# Patient Record
Sex: Female | Born: 1985 | Race: Black or African American | Hispanic: No | Smoking: Current every day smoker
Health system: Southern US, Community
[De-identification: ages and names within clinical notes are randomized; demographics above are authoritative.]

---

## 2017-08-27 ENCOUNTER — Encounter (HOSPITAL_COMMUNITY): Payer: Self-pay | Admitting: Emergency Medicine

## 2017-08-27 ENCOUNTER — Emergency Department (HOSPITAL_COMMUNITY)
Admission: EM | Admit: 2017-08-27 | Discharge: 2017-08-27 | Disposition: A | Discharge status: Home / Self Care | Payer: Medicaid Other | Attending: Emergency Medicine | Admitting: Emergency Medicine

## 2017-08-27 DIAGNOSIS — F17200 Nicotine dependence, unspecified, uncomplicated: Secondary | ICD-10-CM | POA: Insufficient documentation

## 2017-08-27 DIAGNOSIS — B9689 Other specified bacterial agents as the cause of diseases classified elsewhere: Secondary | ICD-10-CM

## 2017-08-27 DIAGNOSIS — A5901 Trichomonal vulvovaginitis: Secondary | ICD-10-CM | POA: Diagnosis not present

## 2017-08-27 DIAGNOSIS — N898 Other specified noninflammatory disorders of vagina: Secondary | ICD-10-CM | POA: Diagnosis present

## 2017-08-27 DIAGNOSIS — N760 Acute vaginitis: Secondary | ICD-10-CM | POA: Diagnosis not present

## 2017-08-27 LAB — URINALYSIS, ROUTINE W REFLEX MICROSCOPIC
Bilirubin Urine: NEGATIVE
Glucose, UA: NEGATIVE mg/dL
Hgb urine dipstick: NEGATIVE
Ketones, ur: NEGATIVE mg/dL
Leukocytes, UA: NEGATIVE
Nitrite: NEGATIVE
Protein, ur: NEGATIVE mg/dL
Specific Gravity, Urine: 1.008 (ref 1.005–1.030)
pH: 6 (ref 5.0–8.0)

## 2017-08-27 LAB — WET PREP, GENITAL
Sperm: NONE SEEN
YEAST WET PREP: NONE SEEN

## 2017-08-27 LAB — POC URINE PREG, ED: Preg Test, Ur: NEGATIVE

## 2017-08-27 MED ORDER — METRONIDAZOLE 500 MG PO TABS
2000.0000 mg | ORAL_TABLET | Freq: Once | ORAL | Status: AC
  Administered 2017-08-27: 2000 mg via ORAL
  Filled 2017-08-27: qty 4

## 2017-08-27 MED ORDER — CEFTRIAXONE SODIUM 250 MG IJ SOLR
250.0000 mg | Freq: Once | INTRAMUSCULAR | Status: AC
  Administered 2017-08-27: 250 mg via INTRAMUSCULAR
  Filled 2017-08-27: qty 250

## 2017-08-27 MED ORDER — METRONIDAZOLE 500 MG PO TABS: 500.0000 mg | ORAL_TABLET | Freq: Two times a day (BID) | ORAL | 0 refills | Status: DC

## 2017-08-27 MED ORDER — AZITHROMYCIN 250 MG PO TABS
1000.0000 mg | ORAL_TABLET | Freq: Once | ORAL | Status: AC
  Administered 2017-08-27: 1000 mg via ORAL
  Filled 2017-08-27: qty 4

## 2017-08-27 NOTE — ED Triage Notes (Signed)
Pt presents to ED for assessment of vaginal itching, discharge x 2 days.  States she "may have been exposed to a bad soap".

## 2017-08-27 NOTE — Discharge Instructions (Addendum)
Take Flagyl as prescribed until all gone.  No intercourse for a week.  Please make sure that your partners treated as well.  Your gonorrhea and Chlamydia cultures are still pending and if come back abnormal we will contact you.  Return as needed.

## 2017-08-27 NOTE — ED Notes (Signed)
EMT called for patient with no response x 3

## 2017-08-27 NOTE — ED Provider Notes (Signed)
[facility] Note   CSN: [id number] Arrival date & time: 08/27/17  1610     History   Chief Complaint Chief Complaint  Patient presents with  . Vaginal Discharge    HPI Stefanie Murray is a 32 y.o. female.  =    Stefanie Murray is a 32 y.o. female presents emergency department complaining of vaginal discharge.  She states vaginal discharge for several days.  She states she always uses protection, condoms, but states once the condom came off.  She also admits to itching.  Denies any pain.  Denies any vaginal bleeding.  States discharge is white.  No recent antibiotics.  She states she recently switched to a new soap and maybe this was causing her symptoms.  Nothing making her symptoms better or worse.  No urinary symptoms.  History reviewed. No pertinent past medical history.  There are no active problems to display for this patient.   History reviewed. No pertinent surgical history.   OB History   None      Home Medications    Prior to Admission medications   Not on File    Family History History reviewed. No pertinent family history.  Social History Social History   Tobacco Use  . Smoking status: Current Every Day Smoker    Packs/day: 0.25  . Smokeless tobacco: Never Used  Substance Use Topics  . Alcohol use: Not Currently  . Drug use: Never     Allergies   Patient has no allergy information on record.   Review of Systems Review of Systems  Constitutional: Negative for chills and fever.  Respiratory: Negative for cough, chest tightness and shortness of breath.   Cardiovascular: Negative for chest pain, palpitations and leg swelling.  Gastrointestinal: Negative for abdominal pain, diarrhea, nausea and vomiting.  Genitourinary: Positive for vaginal discharge. Negative for dysuria, flank pain, pelvic pain, vaginal bleeding and vaginal pain.  Musculoskeletal: Negative for arthralgias, myalgias, neck pain  and neck stiffness.  Skin: Negative for rash.  Neurological: Negative for dizziness, weakness and headaches.  All other systems reviewed and are negative.    Physical Exam Updated Vital Signs BP (!) 141/90 (BP Location: Right Arm)   Pulse 61   Temp 98.1 F (36.7 C) (Oral)   Resp 16   Ht 5\' 8"  (1.727 m)   Wt 81.6 kg   LMP 08/24/2017   SpO2 100%   BMI 27.37 kg/m   Physical Exam  Constitutional: She appears well-developed and well-nourished. No distress.  HENT:  Head: Normocephalic.  Eyes: Conjunctivae are normal.  Neck: Neck supple.  Cardiovascular: Normal rate, regular rhythm and normal heart sounds.  Pulmonary/Chest: Effort normal and breath sounds normal. No respiratory distress. She has no wheezes. She has no rales.  Abdominal: Soft. Bowel sounds are normal. She exhibits no distension. There is no tenderness. There is no rebound.  Genitourinary:  Genitourinary Comments: Normal external genitalia. Normal vaginal canal. Small thin white discharge. Cervix is normal, closed. No CMT. No uterine or adnexal tenderness. No masses palpated.    Musculoskeletal: She exhibits no edema.  Neurological: She is alert.  Skin: Skin is warm and dry.  Psychiatric: She has a normal mood and affect. Her behavior is normal.  Nursing note and vitals reviewed.    ED Treatments / Results  Labs (all labs ordered are listed, but only abnormal results are displayed) Labs Reviewed  URINALYSIS, ROUTINE W REFLEX MICROSCOPIC - Abnormal; Notable for the following components:  Result Value   Color, Urine STRAW (*)    All other components within normal limits  WET PREP, GENITAL  POC URINE PREG, ED  GC/CHLAMYDIA PROBE AMP ([place]) NOT AT [facility]    EKG None  Radiology No results found.  Procedures Procedures (including critical care time)  Medications Ordered in ED Medications - No data to display   Initial Impression / Assessment and Plan / ED Course  I have reviewed the  triage vital signs and the nursing notes.  Pertinent labs & imaging results that were available during my care of the patient were reviewed by me and considered in my medical decision making (see chart for details).     Patient with vaginal discharge onset several days ago, no abdominal pain, no fever chills, urinary symptoms.  Pelvic exam is really unremarkable, however wet prep shows trichomonas, clue cells, few WBCs.  Will cover with Rocephin and Zithromax in emergency department.  2 g of Flagyl given as well.  We will send her home with flagyl and close outpatient follow up as needed. Return precautions discussed.   Vitals:   08/27/17 1631 08/27/17 1749  BP: (!) 141/90 120/83  Pulse: 61 60  Resp: 16   Temp: 98.1 F (36.7 C)   SpO2: 100% 100%     Final Clinical Impressions(s) / ED Diagnoses   Final diagnoses:  Trichomonas vaginitis  BV (bacterial vaginosis)    ED Discharge Orders         Ordered    metroNIDAZOLE (FLAGYL) 500 MG tablet  2 times daily     08/27/17 1928           Jaynie Crumble, PA-C 08/28/17 0034    Raeford Razor, MD 09/01/17 1441

## 2017-08-29 LAB — GC/CHLAMYDIA PROBE AMP (~~LOC~~) NOT AT ARMC
Chlamydia: NEGATIVE
NEISSERIA GONORRHEA: NEGATIVE

## 2018-07-04 ENCOUNTER — Encounter (HOSPITAL_COMMUNITY): Payer: Self-pay

## 2018-07-04 ENCOUNTER — Other Ambulatory Visit: Payer: Self-pay

## 2018-07-04 ENCOUNTER — Ambulatory Visit (HOSPITAL_COMMUNITY)
Admission: EM | Admit: 2018-07-04 | Discharge: 2018-07-04 | Disposition: A | Discharge status: Home / Self Care | Payer: Medicaid Other | Attending: Family Medicine | Admitting: Family Medicine

## 2018-07-04 DIAGNOSIS — R112 Nausea with vomiting, unspecified: Secondary | ICD-10-CM

## 2018-07-04 DIAGNOSIS — Z3202 Encounter for pregnancy test, result negative: Secondary | ICD-10-CM

## 2018-07-04 LAB — POCT URINALYSIS DIP (DEVICE)
Glucose, UA: NEGATIVE mg/dL
Ketones, ur: 15 mg/dL — AB
Leukocytes,Ua: NEGATIVE
Nitrite: NEGATIVE
Protein, ur: 30 mg/dL — AB
Specific Gravity, Urine: 1.025 (ref 1.005–1.030)
Urobilinogen, UA: 0.2 mg/dL (ref 0.0–1.0)
pH: 7 (ref 5.0–8.0)

## 2018-07-04 LAB — POCT PREGNANCY, URINE: Preg Test, Ur: NEGATIVE

## 2018-07-04 MED ORDER — ONDANSETRON 4 MG PO TBDP
ORAL_TABLET | ORAL | Status: AC
  Filled 2018-07-04: qty 1

## 2018-07-04 MED ORDER — ONDANSETRON HCL 4 MG PO TABS: 4.0000 mg | ORAL_TABLET | Freq: Four times a day (QID) | ORAL | 0 refills | Status: AC

## 2018-07-04 MED ORDER — ONDANSETRON 4 MG PO TBDP: 4.0000 mg | ORAL_TABLET | Freq: Once | ORAL | Status: DC

## 2018-07-04 MED ORDER — ONDANSETRON 4 MG PO TBDP
4.0000 mg | ORAL_TABLET | Freq: Once | ORAL | Status: AC
  Administered 2018-07-04: 4 mg via ORAL

## 2018-07-04 MED ORDER — KETOROLAC TROMETHAMINE 30 MG/ML IJ SOLN
30.0000 mg | Freq: Once | INTRAMUSCULAR | Status: AC
  Administered 2018-07-04: 30 mg via INTRAMUSCULAR

## 2018-07-04 MED ORDER — KETOROLAC TROMETHAMINE 30 MG/ML IJ SOLN
INTRAMUSCULAR | Status: AC
  Filled 2018-07-04: qty 1

## 2018-07-04 NOTE — ED Triage Notes (Signed)
Pt states she has back pain, nausea and vomiting . This started today early this morning.

## 2018-07-04 NOTE — ED Provider Notes (Signed)
[facility]    CSN: [id number] Arrival date & time: 07/04/18  1643     History   Chief Complaint Chief Complaint  Patient presents with  . Back Pain  . Emesis    HPI Stefanie Murray is a 33 y.o. female presenting for acute concern of nausea and vomiting.  Patient states that she developed nausea and vomiting early this morning.  Has not been able to keep down any fluid, food.  States that her emesis is non-biliary, without blood.  Patient is endorsing generalized abdominal pain as well as some back pain.  Patient states "it just hurts all over ".  Denies urinary symptoms, pelvic or vaginal pain or discharge.  Patient continuously requests medication for her stomach pain.  States that she feels nauseous.  Overall, history is limited second to patient's cooperation and lack of engagement with examiner.   History reviewed. No pertinent past medical history.  There are no active problems to display for this patient.   History reviewed. No pertinent surgical history.  OB History   No obstetric history on file.      Home Medications    Prior to Admission medications   Medication Sig Start Date End Date Taking? Authorizing Provider  metroNIDAZOLE (FLAGYL) 500 MG tablet Take 1 tablet (500 mg total) by mouth 2 (two) times daily. 08/27/17   Kirichenko, Tatyana, PA-C  ondansetron (ZOFRAN) 4 MG tablet Take 1 tablet (4 mg total) by mouth every 6 (six) hours for 5 days. 07/04/18 07/09/18  Hall-Potvin, [place], PA-C    Family History History reviewed. No pertinent family history.  Social History Social History   Tobacco Use  . Smoking status: Current Every Day Smoker    Packs/day: 0.25  . Smokeless tobacco: Never Used  Substance Use Topics  . Alcohol use: Not Currently  . Drug use: Never     Allergies   Patient has no known allergies.   Review of Systems As per HPI   Physical Exam Triage Vital Signs ED Triage Vitals  Enc Vitals Group     BP      Pulse       Resp      Temp      Temp src      SpO2      Weight      Height      Head Circumference      Peak Flow      Pain Score      Pain Loc      Pain Edu?      Excl. in [place]?    No data found.  Updated Vital Signs BP (!) 118/58   Pulse (!) 115   Temp 99.8 F (37.7 C) (Oral)   Resp 16   Wt 180 lb (81.6 kg)   LMP 06/27/2018   SpO2 97%   BMI 27.37 kg/m   Visual Acuity Right Eye Distance:   Left Eye Distance:   Bilateral Distance:    Right Eye Near:   Left Eye Near:    Bilateral Near:     Physical Exam Constitutional:      General: She is not in acute distress. HENT:     Head: Normocephalic and atraumatic.  Eyes:     General: No scleral icterus.    Pupils: Pupils are equal, round, and reactive to light.  Cardiovascular:     Rate and Rhythm: Normal rate.  Pulmonary:     Effort: Pulmonary effort is normal.  Abdominal:     General: Abdomen is flat. Bowel sounds are normal. There is no distension.     Palpations: Abdomen is soft.     Tenderness: There is abdominal tenderness. There is no guarding or rebound.     Comments: Patient has generalized tenderness to palpation without rebound, guarding.  Patient refuses to sit up, unable to assess CVA tenderness.  Skin:    Coloration: Skin is not jaundiced or pale.  Neurological:     Mental Status: She is alert and oriented to person, place, and time.      UC Treatments / Results  Labs (all labs ordered are listed, but only abnormal results are displayed) Labs Reviewed  POCT URINALYSIS DIP (DEVICE) - Abnormal; Notable for the following components:      Result Value   Bilirubin Urine SMALL (*)    Ketones, ur 15 (*)    Hgb urine dipstick LARGE (*)    Protein, ur 30 (*)    All other components within normal limits  POC URINE PREG, ED  POCT PREGNANCY, URINE    EKG   Radiology No results found.  Procedures Procedures (including critical care time)  Medications Ordered in UC Medications  ondansetron  (ZOFRAN-ODT) disintegrating tablet 4 mg (4 mg Oral Given 07/04/18 1811)  ketorolac (TORADOL) 30 MG/ML injection 30 mg (30 mg Intramuscular Given 07/04/18 1909)  ondansetron (ZOFRAN-ODT) 4 MG disintegrating tablet (has no administration in time range)  ketorolac (TORADOL) 30 MG/ML injection (has no administration in time range)    Initial Impression / Assessment and Plan / UC Course  I have reviewed the triage vital signs and the nursing notes.  Pertinent labs & imaging results that were available during my care of the patient were reviewed by me and considered in my medical decision making (see chart for details).     33 year old female with 1 day history of nausea and vomiting.  Patient reporting poor p.o. intake, and is tachycardic with elevated temperature in office - patient elected to try oral rehydration with Zofran, declined IV fluids with zofran.  Patient was given sublingual dissolvable Zofran which she tolerated well.  States that this improved her nausea and was able to sip on some water prior to discharge.  Pregnancy test negative, patient given Toradol for pain, which she also tolerated well.  POCT urinalysis significant for small bilirubin, 15 ketones, large hemoglobin, 30 protein.  Negative for nitrates or leukocytes, did not send culture.  Discussed utility of COVID testing given sudden onset and severity of symptoms in setting of global pandemic, patient seems to be agreeable to this: Test ordered.  Instructed patient to stay at home, take Zofran sublingually and focus on slow, oral rehydration with water and Gatorade.  Patient to follow-up tomorrow in urgent care for reevaluation/possible IV fluids, or go to ER overnight if symptoms worsen.  Patient will need to isolate until results of COVID test.  Patient is agreeable to current treatment plan, electing to go home for oral rehydration.  She also verbalized understanding of return precautions. Final Clinical Impressions(s) / UC  Diagnoses   Final diagnoses:  Nausea and vomiting, intractability of vomiting not specified, unspecified vomiting type     Discharge Instructions     May use Zofran as needed for nausea. It is very important to go home, sip on fluids throughout the evening.  If you are not feeling better tomorrow please return to our office for further evaluation and treatment. If you feel significantly worse in  the interim, please go to the ER for further evaluation.    ED Prescriptions    Medication Sig Dispense Auth. Provider   ondansetron (ZOFRAN) 4 MG tablet Take 1 tablet (4 mg total) by mouth every 6 (six) hours for 5 days. 20 tablet Hall-Potvin, [place], PA-C     Controlled Substance Prescriptions [place] Controlled Substance Registry consulted? Not Applicable   Shea EvansHall-Potvin, Brittany, [place] JerseyPA-C 07/04/18 2134

## 2018-07-04 NOTE — Discharge Instructions (Signed)
May use Zofran as needed for nausea. It is very important to go home, sip on fluids throughout the evening.  If you are not feeling better tomorrow please return to our office for further evaluation and treatment. If you feel significantly worse in the interim, please go to the ER for further evaluation.

## 2018-07-05 ENCOUNTER — Telehealth: Payer: Self-pay | Admitting: *Deleted

## 2018-07-05 NOTE — Telephone Encounter (Signed)
-----   Message from [place], [place] sent at 07/04/2018  9:31 PM EDT ----- Regarding: covid testing needed Patient with sudden onset myalgias, elevated temperature, nausea, vomiting, abdominal pain.

## 2018-07-05 NOTE — Telephone Encounter (Signed)
Have attempted to contact patient @ [phone] and number is not working.

## 2018-07-07 NOTE — Telephone Encounter (Signed)
Attempted to call pt. At [phone]; rec'd automated message stating the number is "not reachable."   Unable to find any alternate contact numbers for the patient.

## 2018-07-13 ENCOUNTER — Ambulatory Visit (HOSPITAL_COMMUNITY)
Admission: EM | Admit: 2018-07-13 | Discharge: 2018-07-13 | Discharge status: Against Medical Advice | Payer: Medicaid Other

## 2018-07-13 NOTE — ED Notes (Signed)
Called x3 in the lobby, no answer.

## 2019-06-24 ENCOUNTER — Emergency Department (HOSPITAL_COMMUNITY): Payer: Medicaid Other

## 2019-06-24 ENCOUNTER — Other Ambulatory Visit: Payer: Self-pay

## 2019-06-24 ENCOUNTER — Emergency Department (HOSPITAL_COMMUNITY)
Admission: EM | Admit: 2019-06-24 | Discharge: 2019-06-24 | Disposition: A | Discharge status: Home / Self Care | Payer: Medicaid Other | Attending: Emergency Medicine | Admitting: Emergency Medicine

## 2019-06-24 ENCOUNTER — Encounter (HOSPITAL_COMMUNITY): Payer: Self-pay

## 2019-06-24 DIAGNOSIS — Y92009 Unspecified place in unspecified non-institutional (private) residence as the place of occurrence of the external cause: Secondary | ICD-10-CM | POA: Diagnosis not present

## 2019-06-24 DIAGNOSIS — R519 Headache, unspecified: Secondary | ICD-10-CM | POA: Insufficient documentation

## 2019-06-24 DIAGNOSIS — M79605 Pain in left leg: Secondary | ICD-10-CM

## 2019-06-24 DIAGNOSIS — R109 Unspecified abdominal pain: Secondary | ICD-10-CM

## 2019-06-24 DIAGNOSIS — S40022A Contusion of left upper arm, initial encounter: Secondary | ICD-10-CM | POA: Diagnosis not present

## 2019-06-24 DIAGNOSIS — Y999 Unspecified external cause status: Secondary | ICD-10-CM | POA: Diagnosis not present

## 2019-06-24 DIAGNOSIS — S301XXA Contusion of abdominal wall, initial encounter: Secondary | ICD-10-CM | POA: Insufficient documentation

## 2019-06-24 DIAGNOSIS — F17200 Nicotine dependence, unspecified, uncomplicated: Secondary | ICD-10-CM | POA: Insufficient documentation

## 2019-06-24 DIAGNOSIS — S02642A Fracture of ramus of left mandible, initial encounter for closed fracture: Secondary | ICD-10-CM

## 2019-06-24 DIAGNOSIS — S4992XA Unspecified injury of left shoulder and upper arm, initial encounter: Secondary | ICD-10-CM | POA: Diagnosis present

## 2019-06-24 DIAGNOSIS — M79602 Pain in left arm: Secondary | ICD-10-CM

## 2019-06-24 DIAGNOSIS — S7012XA Contusion of left thigh, initial encounter: Secondary | ICD-10-CM | POA: Diagnosis not present

## 2019-06-24 DIAGNOSIS — M7062 Trochanteric bursitis, left hip: Secondary | ICD-10-CM | POA: Insufficient documentation

## 2019-06-24 DIAGNOSIS — Y9389 Activity, other specified: Secondary | ICD-10-CM | POA: Insufficient documentation

## 2019-06-24 LAB — COMPREHENSIVE METABOLIC PANEL
ALT: 31 U/L (ref 0–44)
AST: 34 U/L (ref 15–41)
Albumin: 4.2 g/dL (ref 3.5–5.0)
Alkaline Phosphatase: 40 U/L (ref 38–126)
Anion gap: 12 (ref 5–15)
BUN: 8 mg/dL (ref 6–20)
CO2: 24 mmol/L (ref 22–32)
Calcium: 9 mg/dL (ref 8.9–10.3)
Chloride: 105 mmol/L (ref 98–111)
Creatinine, Ser: 0.72 mg/dL (ref 0.44–1.00)
GFR calc Af Amer: >60 mL/min (ref >60)
GFR calc non Af Amer: >60 mL/min (ref >60)
Glucose, Bld: 85 mg/dL (ref 70–99)
Potassium: 3.5 mmol/L (ref 3.5–5.1)
Sodium: 141 mmol/L (ref 135–145)
Total Bilirubin: 0.6 mg/dL (ref 0.3–1.2)
Total Protein: 7.5 g/dL (ref 6.5–8.1)

## 2019-06-24 LAB — CBC
HCT: 38.6 % (ref 36.0–46.0)
Hemoglobin: 12.2 g/dL (ref 12.0–15.0)
MCH: 26.5 pg (ref 26.0–34.0)
MCHC: 31.6 g/dL (ref 30.0–36.0)
MCV: 83.7 fL (ref 80.0–100.0)
Platelets: 207 10*3/uL (ref 150–400)
RBC: 4.61 MIL/uL (ref 3.87–5.11)
RDW: 14 % (ref 11.5–15.5)
WBC: 12.3 10*3/uL — ABNORMAL HIGH (ref 4.0–10.5)
nRBC: 0 % (ref 0.0–0.2)

## 2019-06-24 LAB — I-STAT BETA HCG BLOOD, ED (MC, WL, AP ONLY): I-stat hCG, quantitative: <5 m[IU]/mL (ref <5)

## 2019-06-24 MED ORDER — METHOCARBAMOL 500 MG PO TABS
500.0000 mg | ORAL_TABLET | Freq: Two times a day (BID) | ORAL | 0 refills | Status: DC | PRN
Start: 2019-06-24 — End: 2019-10-21

## 2019-06-24 MED ORDER — IOHEXOL 300 MG/ML  SOLN
100.0000 mL | Freq: Once | INTRAMUSCULAR | Status: AC | PRN
  Administered 2019-06-24: 100 mL via INTRAVENOUS

## 2019-06-24 MED ORDER — MORPHINE SULFATE (PF) 4 MG/ML IV SOLN
4.0000 mg | Freq: Once | INTRAVENOUS | Status: AC
  Administered 2019-06-24: 4 mg via INTRAVENOUS
  Filled 2019-06-24: qty 1

## 2019-06-24 MED ORDER — AMOXICILLIN 500 MG PO CAPS
500.0000 mg | ORAL_CAPSULE | Freq: Two times a day (BID) | ORAL | 0 refills | Status: AC
Start: 2019-06-24 — End: 2019-07-04

## 2019-06-24 MED ORDER — HYDROCODONE-ACETAMINOPHEN 5-325 MG PO TABS: 1.0000 | ORAL_TABLET | Freq: Four times a day (QID) | ORAL | 0 refills | Status: AC | PRN

## 2019-06-24 MED ORDER — SODIUM CHLORIDE (PF) 0.9 % IJ SOLN
INTRAMUSCULAR | Status: AC
  Filled 2019-06-24: qty 50
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
## 2019-06-24 NOTE — ED Notes (Signed)
ASO applied to the right ankle.

## 2019-10-20 ENCOUNTER — Encounter (HOSPITAL_COMMUNITY): Payer: Self-pay | Admitting: Emergency Medicine

## 2019-10-20 ENCOUNTER — Other Ambulatory Visit: Payer: Self-pay

## 2019-10-20 ENCOUNTER — Emergency Department (HOSPITAL_COMMUNITY)
Admission: EM | Admit: 2019-10-20 | Discharge: 2019-10-21 | Disposition: A | Discharge status: Home / Self Care | Payer: Medicaid Other | Attending: Emergency Medicine | Admitting: Emergency Medicine

## 2019-10-20 DIAGNOSIS — M62838 Other muscle spasm: Secondary | ICD-10-CM | POA: Insufficient documentation

## 2019-10-20 DIAGNOSIS — F17210 Nicotine dependence, cigarettes, uncomplicated: Secondary | ICD-10-CM | POA: Diagnosis not present

## 2019-10-20 DIAGNOSIS — M549 Dorsalgia, unspecified: Secondary | ICD-10-CM | POA: Diagnosis present

## 2019-10-20 MED ORDER — OXYCODONE-ACETAMINOPHEN 5-325 MG PO TABS
1.0000 | ORAL_TABLET | Freq: Once | ORAL | Status: AC
  Administered 2019-10-20: 1 via ORAL
  Filled 2019-10-20: qty 1
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
## 2019-10-21 MED ORDER — IBUPROFEN 400 MG PO TABS
400.0000 mg | ORAL_TABLET | Freq: Once | ORAL | Status: AC
  Administered 2019-10-21: 400 mg via ORAL
  Filled 2019-10-21: qty 1

## 2019-10-21 MED ORDER — DICLOFENAC SODIUM 3 % EX CREA: 3.0000 g | TOPICAL_CREAM | Freq: Three times a day (TID) | CUTANEOUS | 0 refills | Status: AC | PRN

## 2019-10-21 MED ORDER — IBUPROFEN 800 MG PO TABS: 800.0000 mg | ORAL_TABLET | Freq: Once | ORAL | Status: DC

## 2019-10-21 MED ORDER — ACETAMINOPHEN 500 MG PO TABS
1000.0000 mg | ORAL_TABLET | Freq: Once | ORAL | Status: AC
  Administered 2019-10-21: 1000 mg via ORAL
  Filled 2019-10-21: qty 2

## 2019-10-21 MED ORDER — METHOCARBAMOL 500 MG PO TABS
500.0000 mg | ORAL_TABLET | Freq: Two times a day (BID) | ORAL | 0 refills | Status: AC
Start: 2019-10-21 — End: ?
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
## 2019-10-21 NOTE — Discharge Instructions (Signed)

## 2019-10-21 NOTE — ED Notes (Signed)
Patient verbalizes understanding of discharge instructions. Opportunity for questioning and answers were provided. Armband removed by staff, pt discharged from ED.  
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
## 2023-03-29 ENCOUNTER — Emergency Department (HOSPITAL_COMMUNITY)
Admission: EM | Admit: 2023-03-29 | Discharge: 2023-03-29 | Disposition: A | Discharge status: Home / Self Care | Payer: MEDICAID | Attending: Emergency Medicine | Admitting: Emergency Medicine

## 2023-03-29 ENCOUNTER — Emergency Department (HOSPITAL_COMMUNITY): Payer: MEDICAID

## 2023-03-29 ENCOUNTER — Other Ambulatory Visit: Payer: Self-pay

## 2023-03-29 ENCOUNTER — Encounter (HOSPITAL_COMMUNITY): Payer: Self-pay | Admitting: Emergency Medicine

## 2023-03-29 DIAGNOSIS — Y92810 Car as the place of occurrence of the external cause: Secondary | ICD-10-CM | POA: Diagnosis not present

## 2023-03-29 DIAGNOSIS — X58XXXA Exposure to other specified factors, initial encounter: Secondary | ICD-10-CM | POA: Insufficient documentation

## 2023-03-29 DIAGNOSIS — S99921A Unspecified injury of right foot, initial encounter: Secondary | ICD-10-CM | POA: Diagnosis present

## 2023-03-29 DIAGNOSIS — S92514A Nondisplaced fracture of proximal phalanx of right lesser toe(s), initial encounter for closed fracture: Secondary | ICD-10-CM | POA: Insufficient documentation

## 2023-03-29 MED ORDER — OXYCODONE-ACETAMINOPHEN 5-325 MG PO TABS
1.0000 | ORAL_TABLET | Freq: Once | ORAL | Status: AC
  Administered 2023-03-29: 1 via ORAL
  Filled 2023-03-29: qty 1

## 2023-03-29 NOTE — Discharge Instructions (Addendum)
 It was a pleasure caring for you today. As discussed, please follow-up with orthopedic provider listed in this discharge paperwork.  Seek emergency care experiencing any new or worsening symptoms.  Alternating between 650 mg Tylenol and 400 mg Advil: The best way to alternate taking Acetaminophen (example Tylenol) and Ibuprofen (example Advil/Motrin) is to take them 3 hours apart. For example, if you take ibuprofen at 6 am you can then take Tylenol at 9 am. You can continue this regimen throughout the day, making sure you do not exceed the recommended maximum dose for each drug.
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
## 2023-03-29 NOTE — ED Notes (Signed)
 Ortho tech called

## 2023-03-29 NOTE — Progress Notes (Signed)
 Orthopedic Tech Progress Note Patient Details:  Stefanie Murray 1985-08-23 [id number]  Ortho Devices Type of Ortho Device: CAM walker Ortho Device/Splint Location: rle Ortho Device/Splint Interventions: Ordered, Application, Adjustment   Post Interventions Patient Tolerated: Well Instructions Provided: Care of device, Adjustment of device  Trinna Post 03/29/2023, 3:09 AM
